# Patient Record
Sex: Female | Born: 1995 | Race: Black or African American | Hispanic: No | Marital: Single | Smoking: Never smoker
Health system: Southern US, Community
[De-identification: ages and names within clinical notes are randomized; demographics above are authoritative.]

## PROBLEM LIST (undated history)

## (undated) DIAGNOSIS — J45909 Unspecified asthma, uncomplicated: Secondary | ICD-10-CM

## (undated) DIAGNOSIS — S81859A Open bite, unspecified lower leg, initial encounter: Secondary | ICD-10-CM

---

## 2013-11-09 ENCOUNTER — Emergency Department (HOSPITAL_COMMUNITY): Payer: Self-pay

## 2013-11-09 ENCOUNTER — Encounter (HOSPITAL_COMMUNITY): Payer: Self-pay | Admitting: Emergency Medicine

## 2013-11-09 ENCOUNTER — Emergency Department (HOSPITAL_COMMUNITY)
Admission: EM | Admit: 2013-11-09 | Discharge: 2013-11-09 | Disposition: A | Discharge status: Home / Self Care | Payer: Self-pay | Attending: Emergency Medicine | Admitting: Emergency Medicine

## 2013-11-09 DIAGNOSIS — S298XXA Other specified injuries of thorax, initial encounter: Secondary | ICD-10-CM | POA: Insufficient documentation

## 2013-11-09 DIAGNOSIS — M542 Cervicalgia: Secondary | ICD-10-CM

## 2013-11-09 DIAGNOSIS — R0781 Pleurodynia: Secondary | ICD-10-CM

## 2013-11-09 DIAGNOSIS — S46909A Unspecified injury of unspecified muscle, fascia and tendon at shoulder and upper arm level, unspecified arm, initial encounter: Secondary | ICD-10-CM | POA: Insufficient documentation

## 2013-11-09 DIAGNOSIS — S4980XA Other specified injuries of shoulder and upper arm, unspecified arm, initial encounter: Secondary | ICD-10-CM | POA: Insufficient documentation

## 2013-11-09 DIAGNOSIS — M25512 Pain in left shoulder: Secondary | ICD-10-CM

## 2013-11-09 DIAGNOSIS — S199XXA Unspecified injury of neck, initial encounter: Principal | ICD-10-CM

## 2013-11-09 DIAGNOSIS — S0993XA Unspecified injury of face, initial encounter: Secondary | ICD-10-CM | POA: Insufficient documentation

## 2013-11-09 DIAGNOSIS — Z79899 Other long term (current) drug therapy: Secondary | ICD-10-CM | POA: Insufficient documentation

## 2013-11-09 DIAGNOSIS — Y9389 Activity, other specified: Secondary | ICD-10-CM | POA: Insufficient documentation

## 2013-11-09 DIAGNOSIS — J45909 Unspecified asthma, uncomplicated: Secondary | ICD-10-CM | POA: Insufficient documentation

## 2013-11-09 DIAGNOSIS — Y92410 Unspecified street and highway as the place of occurrence of the external cause: Secondary | ICD-10-CM | POA: Insufficient documentation

## 2013-11-09 HISTORY — DX: Unspecified asthma, uncomplicated: J45.909

## 2013-11-09 MED ORDER — HYDROCODONE-ACETAMINOPHEN 5-325 MG PO TABS
2.0000 | ORAL_TABLET | Freq: Once | ORAL | Status: AC
  Administered 2013-11-09: 2 via ORAL
  Filled 2013-11-09: qty 2

## 2013-11-09 MED ORDER — IBUPROFEN 800 MG PO TABS: 800.0000 mg | ORAL_TABLET | Freq: Three times a day (TID) | ORAL | Status: AC | PRN

## 2013-11-09 MED ORDER — DIAZEPAM 5 MG PO TABS: 5.0000 mg | ORAL_TABLET | Freq: Three times a day (TID) | ORAL | Status: AC | PRN

## 2013-11-09 MED ORDER — HYDROCODONE-ACETAMINOPHEN 5-325 MG PO TABS: 1.0000 | ORAL_TABLET | ORAL | Status: AC | PRN

## 2013-11-09 NOTE — ED Provider Notes (Signed)
Medical screening examination/treatment/procedure(s) were performed by non-physician practitioner and as supervising physician I was immediately available for consultation/collaboration.   EKG Interpretation None        Layla MawKristen N Hennessy Bartel, DO 11/09/13 1642
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
## 2013-11-09 NOTE — ED Notes (Signed)
PA at bedside.
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
## 2015-06-08 ENCOUNTER — Emergency Department (HOSPITAL_COMMUNITY): Payer: BC Managed Care – PPO

## 2015-06-08 ENCOUNTER — Encounter (HOSPITAL_COMMUNITY): Payer: Self-pay

## 2015-06-08 ENCOUNTER — Emergency Department (HOSPITAL_COMMUNITY)
Admission: EM | Admit: 2015-06-08 | Discharge: 2015-06-08 | Disposition: A | Discharge status: Home / Self Care | Payer: BC Managed Care – PPO | Attending: Emergency Medicine | Admitting: Emergency Medicine

## 2015-06-08 DIAGNOSIS — W540XXA Bitten by dog, initial encounter: Secondary | ICD-10-CM | POA: Diagnosis not present

## 2015-06-08 DIAGNOSIS — F419 Anxiety disorder, unspecified: Secondary | ICD-10-CM | POA: Insufficient documentation

## 2015-06-08 DIAGNOSIS — M79604 Pain in right leg: Secondary | ICD-10-CM

## 2015-06-08 DIAGNOSIS — S81831A Puncture wound without foreign body, right lower leg, initial encounter: Secondary | ICD-10-CM | POA: Insufficient documentation

## 2015-06-08 DIAGNOSIS — R000 Tachycardia, unspecified: Secondary | ICD-10-CM | POA: Insufficient documentation

## 2015-06-08 DIAGNOSIS — S81811A Laceration without foreign body, right lower leg, initial encounter: Secondary | ICD-10-CM

## 2015-06-08 DIAGNOSIS — J45909 Unspecified asthma, uncomplicated: Secondary | ICD-10-CM | POA: Insufficient documentation

## 2015-06-08 DIAGNOSIS — Z23 Encounter for immunization: Secondary | ICD-10-CM | POA: Insufficient documentation

## 2015-06-08 DIAGNOSIS — Y9289 Other specified places as the place of occurrence of the external cause: Secondary | ICD-10-CM | POA: Diagnosis not present

## 2015-06-08 DIAGNOSIS — S8991XA Unspecified injury of right lower leg, initial encounter: Secondary | ICD-10-CM | POA: Diagnosis present

## 2015-06-08 DIAGNOSIS — Y998 Other external cause status: Secondary | ICD-10-CM | POA: Insufficient documentation

## 2015-06-08 DIAGNOSIS — Z79899 Other long term (current) drug therapy: Secondary | ICD-10-CM | POA: Diagnosis not present

## 2015-06-08 DIAGNOSIS — Y9301 Activity, walking, marching and hiking: Secondary | ICD-10-CM | POA: Insufficient documentation

## 2015-06-08 MED ORDER — TETANUS-DIPHTH-ACELL PERTUSSIS 5-2.5-18.5 LF-MCG/0.5 IM SUSP
0.5000 mL | Freq: Once | INTRAMUSCULAR | Status: AC
  Administered 2015-06-08: 0.5 mL via INTRAMUSCULAR
  Filled 2015-06-08: qty 0.5

## 2015-06-08 MED ORDER — LORAZEPAM 2 MG/ML IJ SOLN
0.5000 mg | Freq: Once | INTRAMUSCULAR | Status: AC
  Administered 2015-06-08: 0.5 mg via INTRAVENOUS
  Filled 2015-06-08: qty 1

## 2015-06-08 MED ORDER — MORPHINE SULFATE (PF) 4 MG/ML IV SOLN
4.0000 mg | Freq: Once | INTRAVENOUS | Status: AC
  Administered 2015-06-08: 4 mg via INTRAVENOUS
  Filled 2015-06-08: qty 1

## 2015-06-08 MED ORDER — LIDOCAINE-EPINEPHRINE (PF) 2 %-1:200000 IJ SOLN
10.0000 mL | Freq: Once | INTRAMUSCULAR | Status: AC
  Administered 2015-06-08: 10 mL
  Filled 2015-06-08: qty 20

## 2015-06-08 MED ORDER — HYDROCODONE-ACETAMINOPHEN 5-325 MG PO TABS: 1.0000 | ORAL_TABLET | Freq: Four times a day (QID) | ORAL | Status: AC | PRN

## 2015-06-08 MED ORDER — HYDROCODONE-ACETAMINOPHEN 5-325 MG PO TABS
1.0000 | ORAL_TABLET | Freq: Once | ORAL | Status: AC
  Administered 2015-06-08: 1 via ORAL
  Filled 2015-06-08: qty 1

## 2015-06-08 MED ORDER — AMOXICILLIN-POT CLAVULANATE 875-125 MG PO TABS: 1.0000 | ORAL_TABLET | Freq: Two times a day (BID) | ORAL | Status: AC

## 2015-06-08 MED ORDER — NAPROXEN 500 MG PO TABS: 500.0000 mg | ORAL_TABLET | Freq: Two times a day (BID) | ORAL | Status: AC | PRN

## 2015-06-08 MED ORDER — RABIES IMMUNE GLOBULIN 150 UNIT/ML IM INJ
20.0000 [IU]/kg | INJECTION | Freq: Once | INTRAMUSCULAR | Status: AC
  Administered 2015-06-08: 900 [IU] via INTRAMUSCULAR
  Filled 2015-06-08: qty 6

## 2015-06-08 MED ORDER — SODIUM CHLORIDE 0.9 % IV BOLUS (SEPSIS)
1000.0000 mL | Freq: Once | INTRAVENOUS | Status: AC
  Administered 2015-06-08: 1000 mL via INTRAVENOUS

## 2015-06-08 MED ORDER — RABIES VACCINE, PCEC IM SUSR
1.0000 mL | Freq: Once | INTRAMUSCULAR | Status: AC
  Administered 2015-06-08: 1 mL via INTRAMUSCULAR
  Filled 2015-06-08: qty 1

## 2015-06-08 NOTE — ED Notes (Signed)
Pt has deep puncture wounds to right lower leg from a dog bite.

## 2015-06-08 NOTE — ED Provider Notes (Signed)
CSN: [id number]   Arrival date & time 06/08/15 1622  History  By signing my name below, I, Beth Zuniga, attest that this documentation has been prepared under the direction and in the presence of [employer] PA-C Electronically Signed: Bethel Zuniga, ED Scribe. 06/08/2015. 6:51 PM. Chief Complaint  Patient presents with  . Animal Bite    HPI Patient is a 19 y.o. female presenting with animal bite. The history is provided by the patient. No language interpreter was used.  Animal Bite Contact animal:  Dog Location:  Leg Leg injury location:  R lower leg Time since incident:  10 minutes Pain details:    Quality: Throbbing.   Severity:  Severe   Timing:  Constant   Progression:  Unchanged Incident location:  Outside Provoked: unprovoked   Notifications:  Animal control Animal's rabies vaccination status:  Unknown Animal in possession: no   Tetanus status:  Unknown Relieved by:  Nothing Exacerbated by: Touching the area. Ineffective treatments:  None tried Associated symptoms: no numbness    Beth Zuniga is a 19 y.o. female who presents to the Emergency Department complaining of a dog bite at the right lower leg with onset today just PTA. The pt was walking her dog when an unknown dog came from out of nowhere and bit her. She is not sure if the animal is vaccinated and does not have the dog in their possession at this time, animal control was contacted but also do not have the dog in their possession currently. Associated symptoms include a painful wound at the RLE. The pain is constant, 10/10 in severity, throbbing, and radiates up the calf to the knee, touching the area exacerbates the pain. She took nothing for pain PTA. Last tetanus is unknown. Pt denies numbness, tingling, weakness, chest pain, SOB, abdominal pain, nausea, or vomiting. Denies any other injuries. NKDA.    Past Medical History  Diagnosis Date  . Asthma     History reviewed. No pertinent  past surgical history.  No family history on file.  Social History  Substance Use Topics  . Smoking status: Never Smoker   . Smokeless tobacco: Never Used  . Alcohol Use: No     Review of Systems  Respiratory: Negative for shortness of breath.   Cardiovascular: Negative for chest pain.  Gastrointestinal: Negative for nausea, vomiting and abdominal pain.  Musculoskeletal: Positive for myalgias and arthralgias.  Skin: Positive for wound (painful wound at RLE).  Allergic/Immunologic: Negative for immunocompromised state.  Neurological: Negative for weakness and numbness.   10 Systems reviewed and all are negative for acute change except as noted in the HPI.   Home Medications   Prior to Admission medications   Medication Sig Start Date End Date Taking? Authorizing Provider  albuterol (PROVENTIL HFA;VENTOLIN HFA) 108 (90 BASE) MCG/ACT inhaler Inhale 1 puff into the lungs every 4 (four) hours as needed for wheezing or shortness of breath.    Historical Provider, MD  azithromycin (ZITHROMAX) 250 MG tablet Take 250-500 mg by mouth daily. 500mg  on day one, then 250mg  daily for 5 days. Started 11/08/13    Historical Provider, MD  cetirizine (ZYRTEC) 10 MG tablet Take 10 mg by mouth daily.    Historical Provider, MD  diazepam (VALIUM) 5 MG tablet Take 1 tablet (5 mg total) by mouth every 8 (eight) hours as needed (muscle spasm or pain). 11/09/13   Trixie Dredge, PA-C  EPINEPHrine (EPIPEN) 0.3 mg/0.3 mL SOAJ injection Inject 0.3 mg into the muscle once.  Historical Provider, MD  HYDROcodone-acetaminophen (NORCO/VICODIN) 5-325 MG per tablet Take 1-2 tablets by mouth every 4 (four) hours as needed. 11/09/13   Trixie DredgeEmily West, PA-C  ibuprofen (ADVIL,MOTRIN) 800 MG tablet Take 1 tablet (800 mg total) by mouth every 8 (eight) hours as needed for mild pain or moderate pain. 11/09/13   Trixie DredgeEmily West, PA-C  promethazine-codeine (PHENERGAN WITH CODEINE) 6.25-10 MG/5ML syrup Take 5 mLs by mouth every 8 (eight) hours  as needed for cough.    Historical Provider, MD    Allergies  Other  Triage Vitals: Temp 99.1 F  BP 120/93 mmHg  Pulse 125  Resp 16  SpO2 100%  LMP 05/26/2015   Physical Exam  Constitutional: She is oriented to person, place, and time. She appears well-developed and well-nourished.  Non-toxic appearance. She appears distressed (anxious and tearful).  Afebrile, nontoxic, tearful and anxious appearing, mildly tachycardic  HENT:  Head: Normocephalic and atraumatic.  Mouth/Throat: Mucous membranes are normal.  Eyes: Conjunctivae and EOM are normal. Right eye exhibits no discharge. Left eye exhibits no discharge.  Neck: Normal range of motion. Neck supple.  Cardiovascular: Intact distal pulses.  Tachycardia present.   Tachycardic likely due to anxiety   Pulmonary/Chest: Effort normal. No respiratory distress.  Abdominal: Normal appearance. She exhibits no distension.  Musculoskeletal: Normal range of motion.       Right lower leg: She exhibits tenderness, bony tenderness and laceration. She exhibits no deformity.       Legs: Right lower leg with 2 deep puncture wounds to the posterolateral calf with exposed muscle and ongoing bleeding oozing from the site, and another puncture wound to the anterior calf extending to the tibia without ongoing bleeding, with exquisite TTP along the anterior tibia, with an additional semi-circular bite mark to the knee cap. Strength and sensation grossly intact although ROM of the knee and ankle limited somewhat due to pain. No gross deformities. Soft compartments. Distal pulses intact. SEE PICTURES BELOW.   Neurological: She is alert and oriented to person, place, and time. She has normal strength. No sensory deficit.  Skin: Skin is warm and dry. Laceration noted. No rash noted.  Right leg lacerations as noted above and pictured below.   Psychiatric: Her behavior is normal. Her mood appears anxious.  Nursing note and vitals reviewed.         ED  Course  Irrigation and debridement Date/Time: 06/08/2015 6:28 PM Performed by: Allen DerryAMPRUBI-SOMS, Keynan Heffern Authorized by: Allen DerryAMPRUBI-SOMS, Jameelah Watts Consent: Verbal consent obtained. Risks and benefits: risks, benefits and alternatives were discussed Consent given by: patient Patient understanding: patient states understanding of the procedure being performed Patient consent: the patient's understanding of the procedure matches consent given Patient identity confirmed: verbally with patient Preparation: Patient was prepped and draped in the usual sterile fashion. Local anesthesia used: yes Local anesthetic: lidocaine 2% with epinephrine Anesthetic total: 6 ml Patient sedated: no Patient tolerance: Patient tolerated the procedure well with no immediate complications  .Marland Kitchen.Laceration Repair Date/Time: 06/08/2015 6:28 PM Performed by: Allen DerryAMPRUBI-SOMS, Owyn Raulston Authorized by: Allen DerryAMPRUBI-SOMS, Jlen Wintle Consent: Verbal consent obtained. Risks and benefits: risks, benefits and alternatives were discussed Consent given by: patient Patient understanding: patient states understanding of the procedure being performed Patient consent: the patient's understanding of the procedure matches consent given Patient identity confirmed: verbally with patient Body area: lower extremity Location details: right lower leg Laceration length: 3 cm Foreign bodies: no foreign bodies Tendon involvement: none Nerve involvement: none Vascular damage: no Anesthesia: local infiltration Local anesthetic: lidocaine 2% with epinephrine  Anesthetic total: 6 ml Patient sedated: no Preparation: Patient was prepped and draped in the usual sterile fashion. Irrigation solution: saline Irrigation method: syringe Amount of cleaning: extensive Debridement: minimal Degree of undermining: none Skin closure: 4-0 Prolene Number of sutures: 1 Technique: horizontal mattress Approximation: loose Approximation difficulty:  complex Dressing: 4x4 sterile gauze Patient tolerance: Patient tolerated the procedure well with no immediate complications Comments: Loose closure of largest wound, allowing for ongoing drainage    DIAGNOSTIC STUDIES: Oxygen Saturation is 100% on RA,  normal by my interpretation.    COORDINATION OF CARE: 4:44 PM Discussed treatment plan which includes Tdap, pain management, rabies vaccination, and XRs of the right tib/fib, and knee with pt at bedside and pt agreed to the plan.  5:59 PM Re-evaluated the patient and her pain is now rated 8/10 in severity.   6:28 PM  4 ml , 2% lidocaine with epinephrine, 4-0 Prolene,  1 suture    Labs Review- Labs Reviewed - No data to display  Imaging Review Dg Tibia/fibula Right  06/08/2015  CLINICAL DATA:  Dog bite injury today with lower leg pain EXAM: RIGHT TIBIA AND FIBULA - 2 VIEW COMPARISON:  None. FINDINGS: No acute bony abnormality is seen. Soft tissue changes are noted consistent with the recent injury. IMPRESSION: Soft tissue injury without bony abnormality. Electronically Signed   By: Alcide Clever M.D.   On: 06/08/2015 17:47   Dg Knee Complete 4 Views Right  06/08/2015  CLINICAL DATA:  Dog bite today with knee pain, initial encounter EXAM: RIGHT KNEE - COMPLETE 4+ VIEW COMPARISON:  None. FINDINGS: No acute bony abnormality is noted. Soft tissue changes are noted along the proximal calf consistent with the recent injury. No other focal abnormality is seen. IMPRESSION: Soft tissue injury without acute bony abnormality. Electronically Signed   By: Alcide Clever M.D.   On: 06/08/2015 17:46    MDM   Final diagnoses:  Dog bite  Pain of right lower extremity  Laceration of leg, right, initial encounter    19 y.o. female here with dog bite to RLE, unknown vaccine status, dog not in their position. Unprovoked attack. Will update tetanus and start rabies vaccine series. Tenderness diffusely over tibia, with puncture extending down to tibia, will  obtain xray imaging. Pt crying and tachycardic, likely from pain and anxiety, will give fluids, ativan, pain control, and then proceed with copious irrigation of the wound. May potentially tack the larger wound together to close the gap slightly, but will reassess after irrigation to see if this is needed. NVI with soft compartments, doubt compartment syndrome at this time. Will reassess shortly.   5:59 PM Xray neg. Lateral compartment above wound now getting more swollen and slightly more tense, but does not appear to be a compartment syndrome at this time. NVI distally. Pain still very intense, VS have improved, will give a little bit more morphine then plan for irrigation with local anesthesia and tack together the lateral wound. Will reassess shortly.   7:01 PM Pain more controlled, copious irrigation performed, some minimal debridement done to large wound on lateral RLE, then placed one horizontal mattress suture loosely to close gap but still allow for drainage. Pt tolerated procedure well. Placed bandages and gave crutches. Discussed f/up in 2 days with [facility] for wound check, then wound clinic f/up in 10 days for ongoing management and suture removal. Will start on augmentin. Will give pain meds. Discussed rabies series at [facility]. Also discussed s/sx of compartment syndrome, and  to return immediately for these signs. I explained the diagnosis and have given explicit precautions to return to the ER including for any other new or worsening symptoms. The patient understands and accepts the medical plan as it's been dictated and I have answered their questions. Discharge instructions concerning home care and prescriptions have been given. The patient is STABLE and is discharged to home in good condition.   I personally performed the services described in this documentation, which was scribed in my presence. The recorded information has been reviewed and is accurate.   BP 117/71 mmHg  Pulse 100  Temp(Src)  98.7 F (37.1 C) (Oral)  Resp 18  Wt 102 lb (46.267 kg)  SpO2 100%  LMP 05/26/2015  Meds ordered this encounter  Medications  . Tdap (BOOSTRIX) injection 0.5 mL    Sig:   . rabies vaccine (RABAVERT) injection 1 mL    Sig:   . rabies immune globulin (HYPERAB) injection 900 Units    Sig:   . sodium chloride 0.9 % bolus 1,000 mL    Sig:   . morphine 4 MG/ML injection 4 mg    Sig:   . LORazepam (ATIVAN) injection 0.5 mg    Sig:   . morphine 4 MG/ML injection 4 mg    Sig:   . lidocaine-EPINEPHrine (XYLOCAINE W/EPI) 2 %-1:200000 (PF) injection 10 mL    Sig:   . HYDROcodone-acetaminophen (NORCO) 5-325 MG tablet    Sig: Take 1 tablet by mouth every 6 (six) hours as needed for severe pain.    Dispense:  20 tablet    Refill:  0    Order Specific Question:  Supervising Provider    Answer:  MILLER, BRIAN [[id number]]  . naproxen (NAPROSYN) 500 MG tablet    Sig: Take 1 tablet (500 mg total) by mouth 2 (two) times daily as needed for mild pain, moderate pain or headache (TAKE WITH MEALS.).    Dispense:  20 tablet    Refill:  0    Order Specific Question:  Supervising Provider    Answer:  MILLER, BRIAN [[id number]]  . amoxicillin-clavulanate (AUGMENTIN) 875-125 MG tablet    Sig: Take 1 tablet by mouth 2 (two) times daily. One po bid x 10 days    Dispense:  20 tablet    Refill:  0    Order Specific Question:  Supervising Provider    Answer:  Evlyn Kanner, PA-C 06/08/15 1904  Rolland Porter, MD 06/15/15 1112

## 2015-06-08 NOTE — ED Notes (Signed)
[employer] in talking with pt at this time  Pt's family at bedside

## 2015-06-08 NOTE — ED Notes (Signed)
Pt here for dog bite to right lower leg, was bitten just PTA. Two puncture wounds to front of leg and two puncture wounds to posterior of leg. Pt appears very distressed and tearful at triage. She is unsure if dog had an up to date rabies vaccination. She did not know the dog.

## 2015-06-08 NOTE — Discharge Instructions (Signed)
Keep wound clean with mild soap and water. Keep area covered with a topical antibiotic ointment and bandage, keep bandage dry, and do not submerge in water for 24 hours. Ice and elevate for additional pain relief and swelling. Alternate between Naprosyn and norco for additional pain relief, do not drive while taking norco. Use crutches to stay off the leg until the stitch is ready to come out in 10 days. Follow up with your primary care doctor or [facility] 2 days for wound recheck , follow up with the wound center in 10 days for recheck and ongoing management of the wound, as well as suture removal. Take antibiotic as directed. Monitor area for signs of infection to include, but not limited to: increasing pain, spreading redness, drainage/pus, worsening swelling, or fevers. Return to emergency department for emergent changing or worsening symptoms, especially any tightness and swelling in the leg near the bites with inability to feel the leg or move the leg.  Remember to follow up with urgent care for your rabies vaccines.     Delayed Wound Closure Sometimes, your health care provider will decide to delay closing a wound for several days. This is done when the wound is badly bruised, dirty, or when it has been several hours since the injury happened. By delaying the closure of your wound, the risk of infection is reduced. Wounds that are closed in 3-7 days after being cleaned up and dressed heal just as well as those that are closed right away. HOME CARE INSTRUCTIONS  Rest and elevate the injured area until the pain and swelling are gone.  Have your wound checked as instructed by your health care provider. SEEK MEDICAL CARE IF:  You develop unusual or increased swelling or redness around the wound.  You have increasing pain or tenderness.  There is increasing fluid (drainage) or a bad smelling drainage coming from the wound.   This information is not intended to  replace advice given to you by your health care provider. Make sure you discuss any questions you have with your health care provider.   Document Released: 07/08/2005 Document Revised: 07/13/2013 Document Reviewed: 01/05/2013 Elsevier Interactive Patient Education 2016 [employer] Inc[employer]  Laceration Care, Adult A laceration is a cut that goes through all of the layers of the skin and into the tissue that is right under the skin. Some lacerations heal on their own. Others need to be closed with stitches (sutures), staples, skin adhesive strips, or skin glue. Proper laceration care minimizes the risk of infection and helps the laceration to heal better. HOW TO CARE FOR YOUR LACERATION If sutures or staples were used:  Keep the wound clean and dry.  If you were given a bandage (dressing), you should change it at least one time per day or as told by your health care provider. You should also change it if it becomes wet or dirty.  Keep the wound completely dry for the first 24 hours or as told by your health care provider. After that time, you may shower or bathe. However, make sure that the wound is not soaked in water until after the sutures or staples have been removed.  Clean the wound one time each day or as told by your health care provider:  Wash the wound with soap and water.  Rinse the wound with water to remove all soap.  Pat the wound dry with a clean towel. Do not rub the wound.  After cleaning the  wound, apply a thin layer of antibiotic ointmentas told by your health care provider. This will help to prevent infection and keep the dressing from sticking to the wound.  Have the sutures or staples removed as told by your health care provider. If skin adhesive strips were used:  Keep the wound clean and dry.  If you were given a bandage (dressing), you should change it at least one time per day or as told by your health care provider. You should also change it if it becomes dirty or  wet.  Do not get the skin adhesive strips wet. You may shower or bathe, but be careful to keep the wound dry.  If the wound gets wet, pat it dry with a clean towel. Do not rub the wound.  Skin adhesive strips fall off on their own. You may trim the strips as the wound heals. Do not remove skin adhesive strips that are still stuck to the wound. They will fall off in time. If skin glue was used:  Try to keep the wound dry, but you may briefly wet it in the shower or bath. Do not soak the wound in water, such as by swimming.  After you have showered or bathed, gently pat the wound dry with a clean towel. Do not rub the wound.  Do not do any activities that will make you sweat heavily until the skin glue has fallen off on its own.  Do not apply liquid, cream, or ointment medicine to the wound while the skin glue is in place. Using those may loosen the film before the wound has healed.  If you were given a bandage (dressing), you should change it at least one time per day or as told by your health care provider. You should also change it if it becomes dirty or wet.  If a dressing is placed over the wound, be careful not to apply tape directly over the skin glue. Doing that may cause the glue to be pulled off before the wound has healed.  Do not pick at the glue. The skin glue usually remains in place for 5-10 days, then it falls off of the skin. General Instructions  Take over-the-counter and prescription medicines only as told by your health care provider.  If you were prescribed an antibiotic medicine or ointment, take or apply it as told by your doctor. Do not stop using it even if your condition improves.  To help prevent scarring, make sure to cover your wound with sunscreen whenever you are outside after stitches are removed, after adhesive strips are removed, or when glue remains in place and the wound is healed. Make sure to wear a sunscreen of at least 30 SPF.  Do not scratch or  pick at the wound.  Keep all follow-up visits as told by your health care provider. This is important.  Check your wound every day for signs of infection. Watch for:  Redness, swelling, or pain.  Fluid, blood, or pus.  Raise (elevate) the injured area above the level of your heart while you are sitting or lying down, if possible. SEEK MEDICAL CARE IF:  You received a tetanus shot and you have swelling, severe pain, redness, or bleeding at the injection site.  You have a fever.  A wound that was closed breaks open.  You notice a bad smell coming from your wound or your dressing.  You notice something coming out of the wound, such as wood or glass.  Your  pain is not controlled with medicine.  You have increased redness, swelling, or pain at the site of your wound.  You have fluid, blood, or pus coming from your wound.  You notice a change in the color of your skin near your wound.  You need to change the dressing frequently due to fluid, blood, or pus draining from the wound.  You develop a new rash.  You develop numbness around the wound. SEEK IMMEDIATE MEDICAL CARE IF:  You develop severe swelling around the wound.  Your pain suddenly increases and is severe.  You develop painful lumps near the wound or on skin that is anywhere on your body.  You have a red streak going away from your wound.  The wound is on your hand or foot and you cannot properly move a finger or toe.  The wound is on your hand or foot and you notice that your fingers or toes look pale or bluish.   This information is not intended to replace advice given to you by your health care provider. Make sure you discuss any questions you have with your health care provider.   Document Released: 07/08/2005 Document Revised: 11/22/2014 Document Reviewed: 07/04/2014 Elsevier Interactive Patient Education 2016 Elsevier Inc.  Cryotherapy Cryotherapy is when you put ice on your injury. Ice helps lessen  pain and puffiness (swelling) after an injury. Ice works the best when you start using it in the first 24 to 48 hours after an injury. HOME CARE  Put a dry or damp towel between the ice pack and your skin.  You may press gently on the ice pack.  Leave the ice on for no more than 10 to 20 minutes at a time.  Check your skin after 5 minutes to make sure your skin is okay.  Rest at least 20 minutes between ice pack uses.  Stop using ice when your skin loses feeling (numbness).  Do not use ice on someone who cannot tell you when it hurts. This includes small children and people with memory problems (dementia). GET HELP RIGHT AWAY IF:  You have white spots on your skin.  Your skin turns blue or pale.  Your skin feels waxy or hard.  Your puffiness gets worse. MAKE SURE YOU:   Understand these instructions.  Will watch your condition.  Will get help right away if you are not doing well or get worse.   This information is not intended to replace advice given to you by your health care provider. Make sure you discuss any questions you have with your health care provider.   Document Released: 12/25/2007 Document Revised: 09/30/2011 Document Reviewed: 02/28/2011 Elsevier Interactive Patient Education [employer]

## 2015-09-05 ENCOUNTER — Encounter (HOSPITAL_COMMUNITY): Payer: Self-pay | Admitting: Emergency Medicine

## 2015-09-05 ENCOUNTER — Emergency Department (HOSPITAL_BASED_OUTPATIENT_CLINIC_OR_DEPARTMENT_OTHER): Payer: BC Managed Care – PPO

## 2015-09-05 ENCOUNTER — Emergency Department (HOSPITAL_COMMUNITY): Payer: BC Managed Care – PPO

## 2015-09-05 ENCOUNTER — Emergency Department (HOSPITAL_COMMUNITY)
Admission: EM | Admit: 2015-09-05 | Discharge: 2015-09-05 | Disposition: A | Discharge status: Home / Self Care | Payer: BC Managed Care – PPO | Attending: Emergency Medicine | Admitting: Emergency Medicine

## 2015-09-05 ENCOUNTER — Emergency Department (HOSPITAL_COMMUNITY)
Admission: EM | Admit: 2015-09-05 | Discharge: 2015-09-05 | Disposition: A | Discharge status: Home / Self Care | Payer: Self-pay

## 2015-09-05 DIAGNOSIS — M79609 Pain in unspecified limb: Secondary | ICD-10-CM

## 2015-09-05 DIAGNOSIS — J45909 Unspecified asthma, uncomplicated: Secondary | ICD-10-CM | POA: Diagnosis not present

## 2015-09-05 DIAGNOSIS — Z87828 Personal history of other (healed) physical injury and trauma: Secondary | ICD-10-CM | POA: Diagnosis not present

## 2015-09-05 DIAGNOSIS — M7989 Other specified soft tissue disorders: Secondary | ICD-10-CM | POA: Diagnosis not present

## 2015-09-05 DIAGNOSIS — M79604 Pain in right leg: Secondary | ICD-10-CM | POA: Diagnosis present

## 2015-09-05 DIAGNOSIS — Z79899 Other long term (current) drug therapy: Secondary | ICD-10-CM | POA: Diagnosis not present

## 2015-09-05 HISTORY — DX: Open bite, unspecified lower leg, initial encounter: S81.859A

## 2015-09-05 LAB — BASIC METABOLIC PANEL
ANION GAP: 10 (ref 5–15)
BUN: 9 mg/dL (ref 6–20)
CALCIUM: 9 mg/dL (ref 8.9–10.3)
CO2: 23 mmol/L (ref 22–32)
CREATININE: 0.71 mg/dL (ref 0.44–1.00)
Chloride: 107 mmol/L (ref 101–111)
Glucose, Bld: 90 mg/dL (ref 65–99)
Potassium: 4 mmol/L (ref 3.5–5.1)
Sodium: 140 mmol/L (ref 135–145)

## 2015-09-05 LAB — CBC WITH DIFFERENTIAL/PLATELET
BASOS ABS: 0 10*3/uL (ref 0.0–0.1)
BASOS PCT: 0 %
EOS ABS: 0.1 10*3/uL (ref 0.0–0.7)
Eosinophils Relative: 1 %
HCT: 30.9 % — ABNORMAL LOW (ref 36.0–46.0)
HEMOGLOBIN: 10.3 g/dL — AB (ref 12.0–15.0)
Lymphocytes Relative: 45 %
Lymphs Abs: 2.6 10*3/uL (ref 0.7–4.0)
MCH: 29.3 pg (ref 26.0–34.0)
MCHC: 33.3 g/dL (ref 30.0–36.0)
MCV: 88 fL (ref 78.0–100.0)
MONO ABS: 0.4 10*3/uL (ref 0.1–1.0)
MONOS PCT: 7 %
NEUTROS PCT: 47 %
Neutro Abs: 2.7 10*3/uL (ref 1.7–7.7)
Platelets: 256 10*3/uL (ref 150–400)
RBC: 3.51 MIL/uL — ABNORMAL LOW (ref 3.87–5.11)
RDW: 12.2 % (ref 11.5–15.5)
WBC: 5.8 10*3/uL (ref 4.0–10.5)

## 2015-09-05 MED ORDER — ACETAMINOPHEN 325 MG PO TABS
650.0000 mg | ORAL_TABLET | Freq: Once | ORAL | Status: DC
  Filled 2015-09-05: qty 2

## 2015-09-05 NOTE — Discharge Instructions (Signed)
You were seen in the ER today for evaluation of right leg pain. Your ultrasound did not show evidence of a blood clot. Your x-ray was normal. Your labs were normal. I do not think you have an infection at this time. Please call your primary care provider to schedule a follow up appointment. You may continue Tylenol or ibuprofen as needed for pain. Return to the ER for new or worsening symptoms.

## 2015-09-05 NOTE — ED Provider Notes (Signed)
History  By signing my name below, I, Karle Plumber, attest that this documentation has been prepared under the direction and in the presence of Treylan Mcclintock, [place]. Electronically Signed: Karle Plumber, ED Scribe. 09/05/2015. 9:07 PM  Chief Complaint  Patient presents with  . Leg Pain   The history is provided by the patient and medical records. No language interpreter was used.    HPI Comments:  Beth Zuniga is a 20 y.o. female who presents to the Emergency Department complaining of lower right leg pain and swelling that began earlier today. She reports associated swelling to the right foot. She rates the pain at 9/10. She states she contacted her PCP in [place] and was told to report to the ED to be checked for DVT. She has not taken anything for pain. She denies modifying factors. She denies SOB, fever, chills. Pt denies being on any birth control. She denies PMHx of cancer, DVT or PE. She denies any recent travel. Pt reports she was bitten by a dog [place] the [place] approximatley three months ago and has had chronic pain since, but reports this is worse than usual.  Past Medical History  Diagnosis Date  . Asthma   . Animal bite of lower leg    History reviewed. No pertinent past surgical history. No family history on file. Social History  Substance Use Topics  . Smoking status: Never Smoker   . Smokeless tobacco: Never Used  . Alcohol Use: No   OB History    No data available     Review of Systems A complete 10 system review of systems was obtained and all systems are negative except as noted in the HPI and PMH.   Allergies  Other  Home Medications   Prior to Admission medications   Medication Sig Start Date End Date Taking? Authorizing Provider  albuterol (PROVENTIL HFA;VENTOLIN HFA) 108 (90 BASE) MCG/ACT inhaler Inhale 1 puff into the lungs every 4 (four) hours as needed for wheezing or shortness of breath.    Historical Provider, MD  amoxicillin-clavulanate  (AUGMENTIN) 875-125 MG tablet Take 1 tablet by mouth 2 (two) times daily. One po bid x 10 days 06/08/15   Mercedes Camprubi-Soms, PA-C  azithromycin (ZITHROMAX) 250 MG tablet Take 250-500 mg by mouth daily.  on day one, then  daily for 5 days. Started 11/08/13    Historical Provider, MD  cetirizine (ZYRTEC) 10 MG tablet Take 10 mg by mouth daily.    Historical Provider, MD  diazepam (VALIUM) 5 MG tablet Take 1 tablet (5 mg total) by mouth every 8 (eight) hours as needed (muscle spasm or pain). 11/09/13   Trixie Dredge, PA-C  EPINEPHrine (EPIPEN) 0.3 mg/0.3 mL SOAJ injection Inject 0.3 mg into the muscle once.    Historical Provider, MD  HYDROcodone-acetaminophen (NORCO) 5-325 MG tablet Take 1 tablet by mouth every 6 (six) hours as needed for severe pain. 06/08/15   Mercedes Camprubi-Soms, PA-C  HYDROcodone-acetaminophen (NORCO/VICODIN) 5-325 MG per tablet Take 1-2 tablets by mouth every 4 (four) hours as needed. 11/09/13   Trixie Dredge, PA-C  ibuprofen (ADVIL,MOTRIN) 800 MG tablet Take 1 tablet (800 mg total) by mouth every 8 (eight) hours as needed for mild pain or moderate pain. 11/09/13   Trixie Dredge, PA-C  naproxen (NAPROSYN) 500 MG tablet Take 1 tablet (500 mg total) by mouth 2 (two) times daily as needed for mild pain, moderate pain or headache (TAKE WITH MEALS.). 06/08/15   Mercedes Camprubi-Soms, PA-C  promethazine-codeine (PHENERGAN WITH CODEINE)  6.25-10 MG/5ML syrup Take 5 mLs by mouth every 8 (eight) hours as needed for cough.    Historical Provider, MD   Triage Vitals: BP 110/66 mmHg  Pulse 80  Temp(Src) 98.1 F (36.7 C) (Oral)  Resp 18  Ht  (1.575 m)  Wt 103 lb (46.72 kg)  BMI 18.83 kg/m2  SpO2 100%  LMP 08/31/2015 Physical Exam  Constitutional: She is oriented to person, place, and time. She appears well-developed and well-nourished.  HENT:  Head: Normocephalic and atraumatic.  Eyes: EOM are normal.  Neck: Normal range of motion.  Cardiovascular: Normal rate.    Pulmonary/Chest: Effort normal.  Musculoskeletal: Normal range of motion.  Right leg with well healing bite wounds. No erythema or drainage. No fluctuance. Mild tenderness to bony aspect of mid shin.   Neurological: She is alert and oriented to person, place, and time.  Skin: Skin is warm and dry.  Psychiatric: She has a normal mood and affect. Her behavior is normal.  Nursing note and vitals reviewed.   ED Course  Procedures (including critical care time) DIAGNOSTIC STUDIES: Oxygen Saturation is 100% on RA, normal by my interpretation.   COORDINATION OF CARE: 6:51 PM- Will order doppler study and Tylenol. Pt verbalizes understanding and agrees to plan.  Medications  acetaminophen (TYLENOL) tablet 650 mg (650 mg Oral Not Given 09/05/15 1949)    Labs Review Labs Reviewed  CBC WITH DIFFERENTIAL/PLATELET - Abnormal; Notable for the following:    RBC 3.51 (*)    Hemoglobin 10.3 (*)    HCT 30.9 (*)    All other components within normal limits  BASIC METABOLIC PANEL    Imaging Review Dg Tibia/fibula Right  09/05/2015  CLINICAL DATA:  Right lower leg pain at the site of a dog bite which occurred approximately 2 months ago. Subsequent encounter. EXAM: RIGHT TIBIA AND FIBULA - 2 VIEW COMPARISON:  Plain films right lower leg and knee 06/08/2015. FINDINGS: There is no evidence of fracture or other focal bone lesions. Soft tissues are unremarkable. IMPRESSION: Normal exam. Electronically Signed   By: Drusilla Kanner M.D.   On: 09/05/2015 20:26   I have personally reviewed and evaluated these images and lab results as part of my medical decision-making.   EKG Interpretation None      MDM   Final diagnoses:  Right leg pain    Wells score 0 for DVT. However, given PCP concern, venous US ordered.  Venous study negative for DVT. On re-eval pt states that her PCP was also concerned about possible infection spreading to her bones as she continues to have pain even though bite  occurred in 05/2015. I discussed with pt at this point she is afebrile with no signs of infection on my exam. However, since PCP is in [place] and she lives here for school will order basic labs and tib/fib X-ray.    Labs and x-ray unremarkable. Discussed with pt. Her PCP is part of [facility] and discussed with pt that they can access her workup via [facility] Everywhere. Encouraged ibuprofen as needed for pain and PCP f/u when possible. ER return precautions given.   I personally performed the services described in this documentation, which was scribed in my presence. The recorded information has been reviewed and is accurate.     Carlene Coria, PA-C 09/06/15 1610  Mancel Bale, MD 09/07/15 [phone]

## 2015-09-05 NOTE — Progress Notes (Signed)
VASCULAR LAB PRELIMINARY  PRELIMINARY  PRELIMINARY  PRELIMINARY  Right lower extremity venous duplex completed.    Preliminary report:  Right:  No evidence of DVT, superficial thrombosis, or Baker's cyst.  Chantz Montefusco, RVT 09/05/2015, 7:38 PM

## 2015-09-05 NOTE — ED Notes (Signed)
Pt. reports pain with swelling at right lower leg onset today , denies injury , pt. is concerned about blood clot , denies SOB / no fever .

## 2017-03-11 IMAGING — DX DG TIBIA/FIBULA 2V*R*
2 series · 2 of 2 positions shown · non-contrast
Comparison: Plain films right lower leg and knee 06/08/2015.

CLINICAL DATA: Right lower leg pain at the site of a dog bite which
occurred approximately 2 months ago. Subsequent encounter.

EXAM:
RIGHT TIBIA AND FIBULA - 2 VIEW

[x tib-fib ap right]
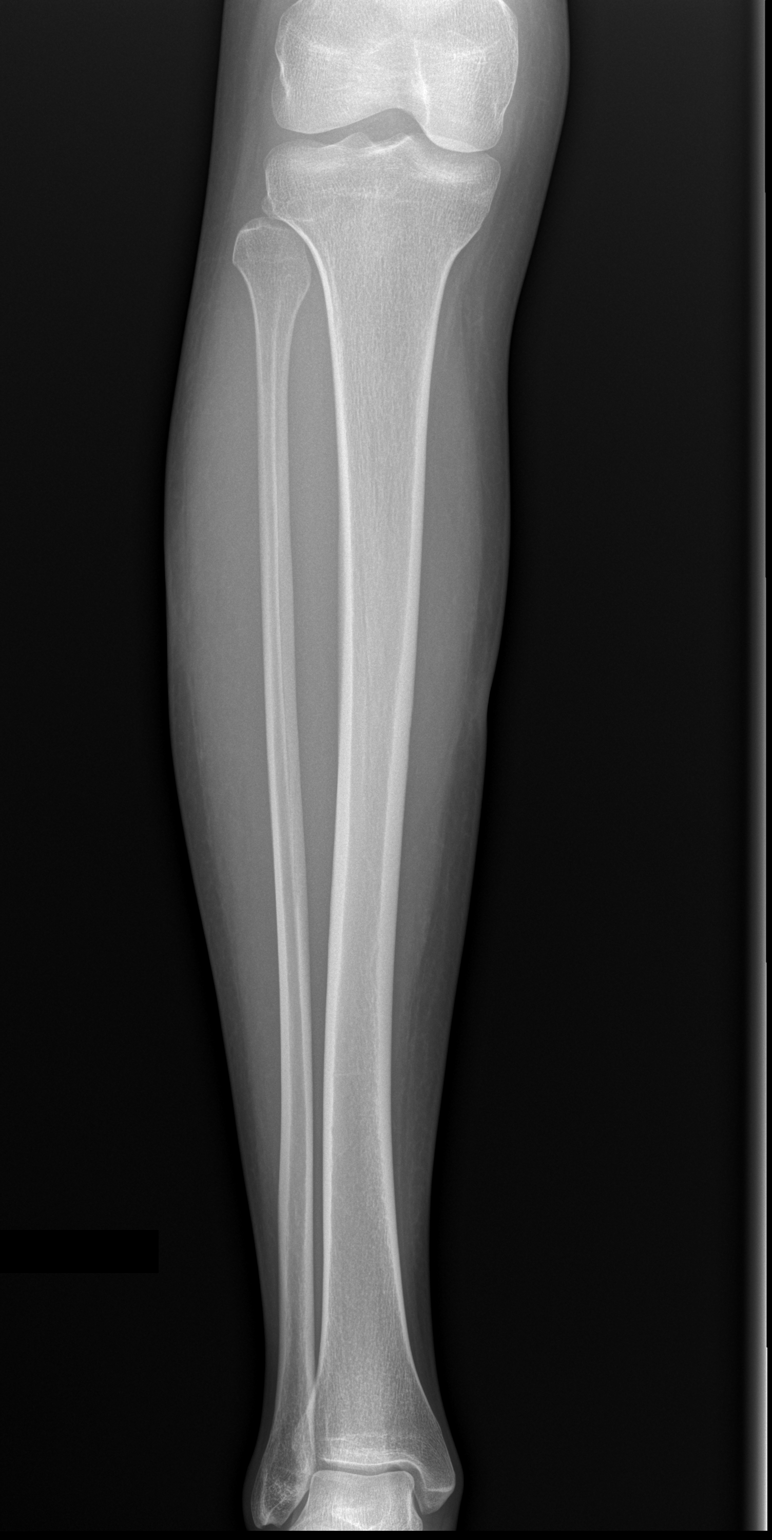

[x tib-fib lat right]
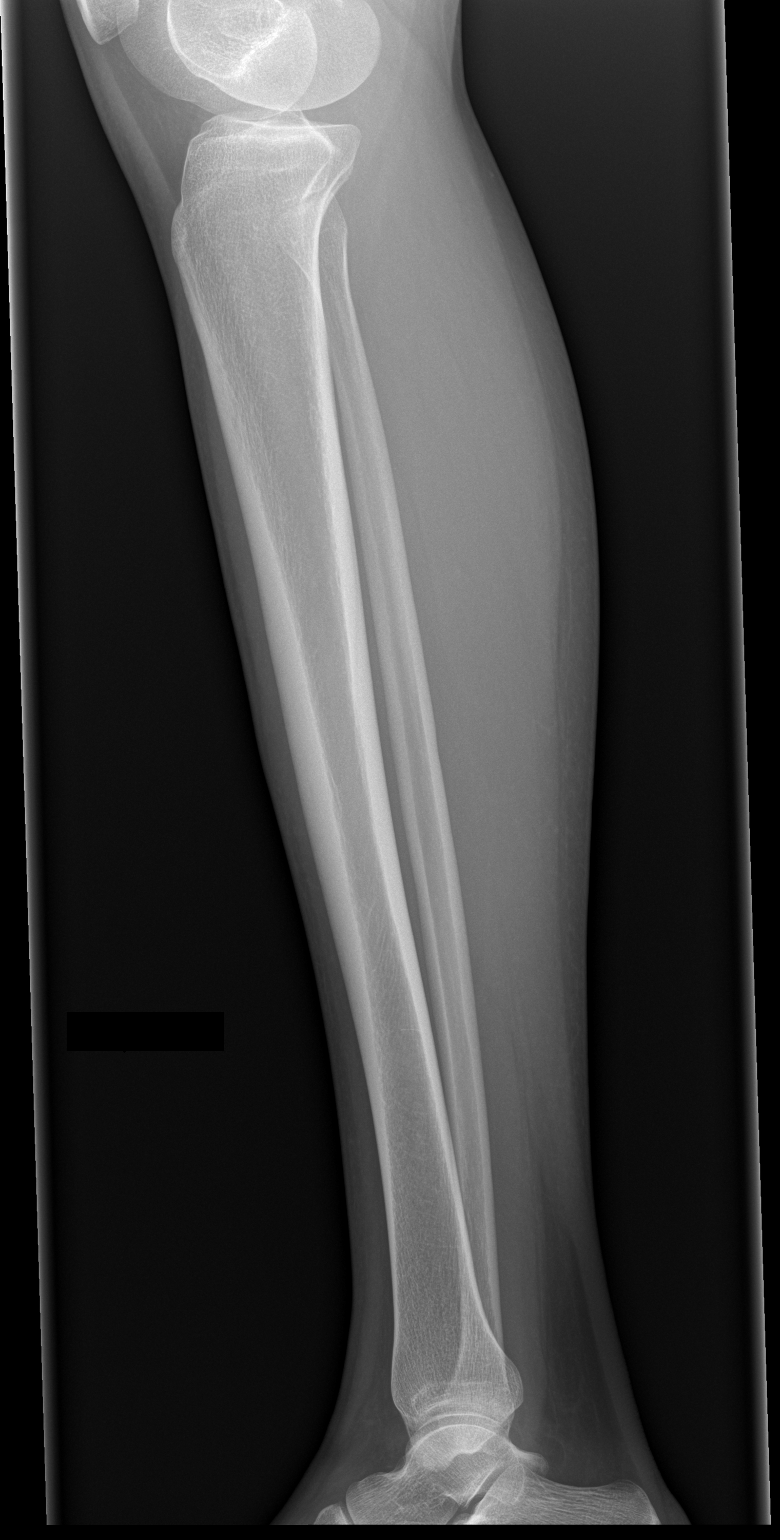

[2 of 2 positions shown; findings below may reference images not displayed]

FINDINGS: There is no evidence of fracture or other focal bone lesions. Soft
tissues are unremarkable.
IMPRESSION: Normal exam.
